# Patient Record
Sex: Male | Born: 1985
Health system: Southern US, Community
[De-identification: ages and names within clinical notes are randomized; demographics above are authoritative.]

---

## 2018-08-05 ENCOUNTER — Ambulatory Visit
Admission: EM | Admit: 2018-08-05 | Discharge: 2018-08-05 | Disposition: A | Discharge status: Home / Self Care | Payer: Self-pay | Attending: Physician Assistant | Admitting: Physician Assistant

## 2018-08-05 DIAGNOSIS — R369 Urethral discharge, unspecified: Secondary | ICD-10-CM | POA: Insufficient documentation

## 2018-08-05 MED ORDER — AZITHROMYCIN 500 MG PO TABS
1000.0000 mg | ORAL_TABLET | Freq: Once | ORAL | Status: AC
  Administered 2018-08-05: 11:00:00 1000 mg via ORAL

## 2018-08-05 MED ORDER — LIDOCAINE HCL (PF) 1 % IJ SOLN
2.0000 mL | Freq: Once | INTRAMUSCULAR | Status: AC
  Administered 2018-08-05: 11:00:00 2 mL

## 2018-08-05 MED ORDER — CEFTRIAXONE SODIUM 250 MG IJ SOLR
250.0000 mg | Freq: Once | INTRAMUSCULAR | Status: AC
  Administered 2018-08-05: 11:00:00 250 mg via INTRAMUSCULAR
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
## 2018-08-05 NOTE — ED Triage Notes (Signed)
Pt c/o penis discharge with burning x3 days

## 2018-08-05 NOTE — Discharge Instructions (Signed)
You were treated empirically for gonorrhea, chlamydia. Azithromycin 1g by mouth and Rocephin 250mg  injection given in office today. Cytology sent, you will be contacted with any positive results that requires further treatment. Refrain from sexual activity and alcohol use for the next 7 days. Monitor for any worsening of symptoms, testicular swelling/pain, ulcers/sore on penis, follow up for reevaluation needed.

## 2018-08-08 LAB — URINE CYTOLOGY ANCILLARY ONLY
Chlamydia: NEGATIVE
Neisseria Gonorrhea: NEGATIVE

## 2019-05-21 ENCOUNTER — Emergency Department (HOSPITAL_COMMUNITY)
Admission: EM | Admit: 2019-05-21 | Discharge: 2019-05-22 | Disposition: A | Discharge status: Home / Self Care | Payer: Self-pay | Attending: Emergency Medicine | Admitting: Emergency Medicine

## 2019-05-21 ENCOUNTER — Other Ambulatory Visit: Payer: Self-pay

## 2019-05-21 ENCOUNTER — Encounter (HOSPITAL_COMMUNITY): Payer: Self-pay | Admitting: Emergency Medicine

## 2019-05-21 DIAGNOSIS — F17210 Nicotine dependence, cigarettes, uncomplicated: Secondary | ICD-10-CM | POA: Insufficient documentation

## 2019-05-21 DIAGNOSIS — R252 Cramp and spasm: Secondary | ICD-10-CM | POA: Insufficient documentation
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
## 2019-05-22 LAB — I-STAT CHEM 8, ED
BUN: 17 mg/dL (ref 6–20)
Calcium, Ion: 1.14 mmol/L — ABNORMAL LOW (ref 1.15–1.40)
Chloride: 102 mmol/L (ref 98–111)
Creatinine, Ser: 1 mg/dL (ref 0.61–1.24)
Glucose, Bld: 83 mg/dL (ref 70–99)
HCT: 49 % (ref 39.0–52.0)
Hemoglobin: 16.7 g/dL (ref 13.0–17.0)
Potassium: 3.7 mmol/L (ref 3.5–5.1)
Sodium: 138 mmol/L (ref 135–145)
TCO2: 27 mmol/L (ref 22–32)

## 2019-05-22 NOTE — ED Notes (Signed)
Patient verbalized understanding of discharge.

## 2019-05-22 NOTE — ED Provider Notes (Signed)
[facility]-EMERGENCY DEPT Provider Note  CSN: [id number] Arrival date & time: 05/21/19 2238  Chief Complaint(s) Numbness  HPI David Patel is a 34 y.o. male who presents to the emergency department for intermittent and sporadic muscle cramping in various muscle groups including legs, abdomen and upper extremities.  No alleviating or aggravating factors.  Patient reports that this occurred couple hours prior to arrival.  Symptoms have resolved.  Patient reports that there was no associated trauma.  He denies any recent fevers or infections.  No excessive exercise.  States that he has been hydrating well.  No vomiting or diarrhea.  Patient endorses marijuana use but denies any other illicit drug use.  No alcohol consumption today.  Patient has not taken any medication.  HPI  Past Medical History History reviewed. No pertinent past medical history. There are no problems to display for this patient.  Home Medication(s) Prior to Admission medications   Not on File                                                                                                                                    Past Surgical History History reviewed. No pertinent surgical history. Family History No family history on file.  Social History Social History   Tobacco Use  . Smoking status: Current Every Day Smoker  . Smokeless tobacco: Never Used  Substance Use Topics  . Alcohol use: Yes  . Drug use: Never   Allergies Patient has no known allergies.  Review of Systems Review of Systems All other systems are reviewed and are negative for acute change except as noted in the HPI  Physical Exam Vital Signs  I have reviewed the triage vital signs BP 118/83 (BP Location: Left Arm)   Pulse 98  Temp 98.6 F (37 C) (Oral)   Resp 16   Ht 5\' 9"  (1.753 m)   Wt 59 kg   SpO2 97%   BMI 19.20 kg/m   Physical Exam Vitals reviewed.  Constitutional:      General: He is not in acute  distress.    Appearance: He is well-developed. He is not diaphoretic.  HENT:     Head: Normocephalic and atraumatic.     Jaw: No trismus.     Right Ear: External ear normal.     Left Ear: External ear normal.     Nose: Nose normal.  Eyes:     General: No scleral icterus.    Conjunctiva/sclera: Conjunctivae normal.  Neck:     Trachea: Phonation normal.  Cardiovascular:     Rate and Rhythm: Normal rate and regular rhythm.  Pulmonary:     Effort: Pulmonary effort is normal. No respiratory distress.     Breath sounds: No stridor.  Abdominal:     General: There is no distension.  Musculoskeletal:        General: No swelling or tenderness. Normal range of motion.  Cervical back: Normal range of motion.     Right lower leg: No edema.     Left lower leg: No edema.     Comments: No Trousseau sign.  Neurological:     Mental Status: He is alert and oriented to person, place, and time.     Sensory: Sensation is intact.     Motor: Motor function is intact. No tremor, atrophy or abnormal muscle tone.  Psychiatric:        Behavior: Behavior normal.     ED Results and Treatments Labs (all labs ordered are listed, but only abnormal results are displayed) Labs Reviewed  I-STAT CHEM 8, ED - Abnormal; Notable for the following components:      Result Value   Calcium, Ion 1.14 (*)    All other components within normal limits                                                                                                                         EKG  EKG Interpretation  Date/Time:    Ventricular Rate:    PR Interval:    QRS Duration:   QT Interval:    QTC Calculation:   R Axis:     Text Interpretation:        Radiology No results found.  Pertinent labs & imaging results that were available during my care of the patient were reviewed by me and considered in my medical decision making (see chart for details).  Medications Ordered in ED Medications - No data to display                                                                                                                                   Procedures Procedures  (including critical care time)  Medical Decision Making / ED Course I have reviewed the nursing notes for this encounter and the patient's prior records (if available in EHR or on provided paperwork).   David Patel was evaluated in Emergency Department on 05/22/2019 for the symptoms described in the history of present illness. He was evaluated in the context of the global COVID-19 pandemic, which necessitated consideration that the patient might be at risk for infection with the SARS-CoV-2 virus that causes COVID-19. Institutional protocols and algorithms that pertain to the evaluation of patients at risk for COVID-19 are in a state of rapid change based on information released by regulatory bodies  including the CDC and federal and state organizations. These policies and algorithms were followed during the patient's care in the ED.       Final Clinical Impression(s) / ED Diagnoses Final diagnoses:  Muscle cramping   Mild hypocalcemia noted on chem panel. Not significant to cause symptoms. Remained asymptomatic.   The patient appears reasonably screened and/or stabilized for discharge and I doubt any other medical condition or other [facility] requiring further screening, evaluation, or treatment in the ED at this time prior to discharge. Safe for discharge with strict return precautions.  Disposition: Discharge  Condition: Good  I have discussed the results, Dx and Tx plan with the patient/family who expressed understanding and agree(s) with the plan. Discharge instructions discussed at length. The patient/family was given strict return precautions who verbalized understanding of the instructions. No further questions at time of discharge.   Declined DC papers.   ED Discharge Orders    None       Follow Up: Primary care provider  Schedule  an appointment as soon as possible for a visit        This chart was dictated using voice recognition software.  Despite best efforts to proofread,  errors can occur which can change the documentation meaning.   Nira Conn, MD 05/22/19 [id number]

## 2019-07-30 ENCOUNTER — Other Ambulatory Visit: Payer: Self-pay

## 2019-07-30 ENCOUNTER — Inpatient Hospital Stay (HOSPITAL_COMMUNITY): Payer: Self-pay

## 2019-07-30 ENCOUNTER — Encounter (HOSPITAL_COMMUNITY): Payer: Self-pay | Admitting: Emergency Medicine

## 2019-07-30 ENCOUNTER — Inpatient Hospital Stay (HOSPITAL_COMMUNITY)
Admission: EM | Admit: 2019-07-30 | Discharge: 2019-08-04 | DRG: 481 | Disposition: A | Discharge status: Home / Self Care | Payer: Self-pay | Attending: Orthopedic Surgery | Admitting: Orthopedic Surgery

## 2019-07-30 ENCOUNTER — Emergency Department (HOSPITAL_COMMUNITY): Payer: Self-pay

## 2019-07-30 DIAGNOSIS — S72352B Displaced comminuted fracture of shaft of left femur, initial encounter for open fracture type I or II: Principal | ICD-10-CM | POA: Diagnosis present

## 2019-07-30 DIAGNOSIS — E559 Vitamin D deficiency, unspecified: Secondary | ICD-10-CM | POA: Diagnosis present

## 2019-07-30 DIAGNOSIS — D62 Acute posthemorrhagic anemia: Secondary | ICD-10-CM | POA: Diagnosis not present

## 2019-07-30 DIAGNOSIS — S72302A Unspecified fracture of shaft of left femur, initial encounter for closed fracture: Secondary | ICD-10-CM | POA: Diagnosis present

## 2019-07-30 DIAGNOSIS — E8889 Other specified metabolic disorders: Secondary | ICD-10-CM | POA: Diagnosis present

## 2019-07-30 DIAGNOSIS — T79A22A Traumatic compartment syndrome of left lower extremity, initial encounter: Secondary | ICD-10-CM | POA: Diagnosis present

## 2019-07-30 DIAGNOSIS — S7292XA Unspecified fracture of left femur, initial encounter for closed fracture: Secondary | ICD-10-CM

## 2019-07-30 DIAGNOSIS — Z20822 Contact with and (suspected) exposure to covid-19: Secondary | ICD-10-CM | POA: Diagnosis present

## 2019-07-30 DIAGNOSIS — W3400XA Accidental discharge from unspecified firearms or gun, initial encounter: Secondary | ICD-10-CM

## 2019-07-30 DIAGNOSIS — T148XXA Other injury of unspecified body region, initial encounter: Secondary | ICD-10-CM

## 2019-07-30 DIAGNOSIS — F17200 Nicotine dependence, unspecified, uncomplicated: Secondary | ICD-10-CM | POA: Diagnosis present

## 2019-07-30 DIAGNOSIS — S7400XA Injury of sciatic nerve at hip and thigh level, unspecified leg, initial encounter: Secondary | ICD-10-CM | POA: Diagnosis present

## 2019-07-30 DIAGNOSIS — F17210 Nicotine dependence, cigarettes, uncomplicated: Secondary | ICD-10-CM | POA: Diagnosis present

## 2019-07-30 DIAGNOSIS — M25462 Effusion, left knee: Secondary | ICD-10-CM | POA: Diagnosis present

## 2019-07-30 DIAGNOSIS — G5702 Lesion of sciatic nerve, left lower limb: Secondary | ICD-10-CM | POA: Diagnosis present

## 2019-07-30 DIAGNOSIS — T79A29A Traumatic compartment syndrome of unspecified lower extremity, initial encounter: Secondary | ICD-10-CM | POA: Diagnosis present

## 2019-07-30 DIAGNOSIS — Y92511 Restaurant or cafe as the place of occurrence of the external cause: Secondary | ICD-10-CM

## 2019-07-30 DIAGNOSIS — Z23 Encounter for immunization: Secondary | ICD-10-CM

## 2019-07-30 DIAGNOSIS — T1490XA Injury, unspecified, initial encounter: Secondary | ICD-10-CM

## 2019-07-30 DIAGNOSIS — S7290XA Unspecified fracture of unspecified femur, initial encounter for closed fracture: Secondary | ICD-10-CM | POA: Diagnosis present

## 2019-07-30 HISTORY — DX: Unspecified fracture of shaft of left femur, initial encounter for closed fracture: S72.302A

## 2019-07-30 LAB — CBC WITH DIFFERENTIAL/PLATELET
Abs Immature Granulocytes: 0.04 10*3/uL (ref 0.00–0.07)
Basophils Absolute: 0.1 10*3/uL (ref 0.0–0.1)
Basophils Relative: 1 %
Eosinophils Absolute: 0.3 10*3/uL (ref 0.0–0.5)
Eosinophils Relative: 4 %
HCT: 48.1 % (ref 39.0–52.0)
Hemoglobin: 14.9 g/dL (ref 13.0–17.0)
Immature Granulocytes: 1 %
Lymphocytes Relative: 39 %
Lymphs Abs: 2.6 10*3/uL (ref 0.7–4.0)
MCH: 26.8 pg (ref 26.0–34.0)
MCHC: 31 g/dL (ref 30.0–36.0)
MCV: 86.4 fL (ref 80.0–100.0)
Monocytes Absolute: 0.6 10*3/uL (ref 0.1–1.0)
Monocytes Relative: 9 %
Neutro Abs: 3.2 10*3/uL (ref 1.7–7.7)
Neutrophils Relative %: 46 %
Platelets: 296 10*3/uL (ref 150–400)
RBC: 5.57 MIL/uL (ref 4.22–5.81)
RDW: 13.8 % (ref 11.5–15.5)
WBC: 6.7 10*3/uL (ref 4.0–10.5)
nRBC: 0 % (ref 0.0–0.2)

## 2019-07-30 LAB — RESPIRATORY PANEL BY RT PCR (FLU A&B, COVID)
Influenza A by PCR: NEGATIVE
Influenza B by PCR: NEGATIVE
SARS Coronavirus 2 by RT PCR: NEGATIVE

## 2019-07-30 LAB — HIV ANTIBODY (ROUTINE TESTING W REFLEX): HIV Screen 4th Generation wRfx: NONREACTIVE

## 2019-07-30 LAB — BASIC METABOLIC PANEL
Anion gap: 17 — ABNORMAL HIGH (ref 5–15)
BUN: 8 mg/dL (ref 6–20)
CO2: 18 mmol/L — ABNORMAL LOW (ref 22–32)
Calcium: 9.2 mg/dL (ref 8.9–10.3)
Chloride: 101 mmol/L (ref 98–111)
Creatinine, Ser: 1.19 mg/dL (ref 0.61–1.24)
GFR calc Af Amer: >60 mL/min (ref >60)
GFR calc non Af Amer: >60 mL/min (ref >60)
Glucose, Bld: 175 mg/dL — ABNORMAL HIGH (ref 70–99)
Potassium: 3.5 mmol/L (ref 3.5–5.1)
Sodium: 136 mmol/L (ref 135–145)

## 2019-07-30 LAB — SURGICAL PCR SCREEN
MRSA, PCR: NEGATIVE
Staphylococcus aureus: NEGATIVE

## 2019-07-30 MED ORDER — METHOCARBAMOL 500 MG PO TABS
500.0000 mg | ORAL_TABLET | Freq: Four times a day (QID) | ORAL | Status: DC | PRN
  Administered 2019-07-30 – 2019-07-31 (×3): 500 mg via ORAL
  Filled 2019-07-30 (×3): qty 1

## 2019-07-30 MED ORDER — DIPHENHYDRAMINE HCL 12.5 MG/5ML PO ELIX: 12.5000 mg | ORAL_SOLUTION | ORAL | Status: DC | PRN

## 2019-07-30 MED ORDER — MORPHINE SULFATE 2 MG/ML IJ SOLN
INTRAMUSCULAR | Status: AC | PRN
  Administered 2019-07-30: 4 mg via INTRAVENOUS

## 2019-07-30 MED ORDER — OXYCODONE HCL 5 MG PO TABS
10.0000 mg | ORAL_TABLET | ORAL | Status: DC | PRN
  Administered 2019-07-30: 15 mg via ORAL
  Administered 2019-07-30: 14:00:00 10 mg via ORAL
  Administered 2019-07-31: 06:00:00 15 mg via ORAL
  Administered 2019-07-31: 10 mg via ORAL
  Administered 2019-07-31 (×2): 15 mg via ORAL
  Filled 2019-07-30: qty 3
  Filled 2019-07-30: qty 2
  Filled 2019-07-30 (×3): qty 3

## 2019-07-30 MED ORDER — ONDANSETRON HCL 4 MG/2ML IJ SOLN
INTRAMUSCULAR | Status: AC
  Filled 2019-07-30: qty 2

## 2019-07-30 MED ORDER — ONDANSETRON HCL 4 MG/2ML IJ SOLN
INTRAMUSCULAR | Status: AC | PRN
  Administered 2019-07-30: 4 mg via INTRAVENOUS

## 2019-07-30 MED ORDER — HYDROMORPHONE HCL 1 MG/ML IJ SOLN
0.5000 mg | INTRAMUSCULAR | Status: DC | PRN
  Administered 2019-07-30: 0.5 mg via INTRAVENOUS
  Administered 2019-07-30 – 2019-07-31 (×3): 1 mg via INTRAVENOUS
  Filled 2019-07-30 (×4): qty 1

## 2019-07-30 MED ORDER — DOCUSATE SODIUM 100 MG PO CAPS
100.0000 mg | ORAL_CAPSULE | Freq: Two times a day (BID) | ORAL | Status: DC
  Filled 2019-07-30: qty 1

## 2019-07-30 MED ORDER — METHOCARBAMOL 1000 MG/10ML IJ SOLN
500.0000 mg | Freq: Four times a day (QID) | INTRAVENOUS | Status: DC | PRN
  Filled 2019-07-30: qty 5

## 2019-07-30 MED ORDER — ACETAMINOPHEN 325 MG PO TABS: 325.0000 mg | ORAL_TABLET | Freq: Four times a day (QID) | ORAL | Status: DC | PRN

## 2019-07-30 MED ORDER — TETANUS-DIPHTH-ACELL PERTUSSIS 5-2.5-18.5 LF-MCG/0.5 IM SUSP
0.5000 mL | Freq: Once | INTRAMUSCULAR | Status: AC
  Administered 2019-07-30: 0.5 mL via INTRAMUSCULAR
  Filled 2019-07-30: qty 0.5

## 2019-07-30 MED ORDER — ONDANSETRON HCL 4 MG/2ML IJ SOLN: 4.0000 mg | Freq: Four times a day (QID) | INTRAMUSCULAR | Status: DC | PRN

## 2019-07-30 MED ORDER — CEFAZOLIN SODIUM-DEXTROSE 2-4 GM/100ML-% IV SOLN: 2.0000 g | Freq: Three times a day (TID) | INTRAVENOUS | Status: DC

## 2019-07-30 MED ORDER — OXYCODONE HCL 5 MG PO TABS
5.0000 mg | ORAL_TABLET | ORAL | Status: DC | PRN
  Administered 2019-07-30: 10 mg via ORAL
  Filled 2019-07-30 (×2): qty 2

## 2019-07-30 MED ORDER — MORPHINE SULFATE (PF) 4 MG/ML IV SOLN
INTRAVENOUS | Status: AC
  Filled 2019-07-30: qty 1

## 2019-07-30 MED ORDER — CEFAZOLIN SODIUM-DEXTROSE 2-4 GM/100ML-% IV SOLN
2.0000 g | Freq: Once | INTRAVENOUS | Status: AC
  Administered 2019-07-30: 07:00:00 2 g via INTRAVENOUS
  Filled 2019-07-30: qty 100

## 2019-07-30 MED ORDER — CEFAZOLIN SODIUM-DEXTROSE 2-4 GM/100ML-% IV SOLN
2.0000 g | Freq: Three times a day (TID) | INTRAVENOUS | Status: AC
  Administered 2019-07-30 – 2019-07-31 (×3): 2 g via INTRAVENOUS
  Filled 2019-07-30 (×3): qty 100

## 2019-07-30 MED ORDER — NAPROXEN 250 MG PO TABS
250.0000 mg | ORAL_TABLET | Freq: Two times a day (BID) | ORAL | Status: DC
  Administered 2019-07-30 (×2): 250 mg via ORAL
  Filled 2019-07-30: qty 1

## 2019-07-30 MED ORDER — ONDANSETRON HCL 4 MG PO TABS: 4.0000 mg | ORAL_TABLET | Freq: Four times a day (QID) | ORAL | Status: DC | PRN
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
## 2019-07-30 NOTE — Progress Notes (Signed)
Orthopedic Tech Progress Note Patient Details:  David Patel 1985-08-12 [id number]  Musculoskeletal Traction Type of Traction: Bucks Skin Traction Traction Location: lle Traction Weight: 10 lbs   Post Interventions Patient Tolerated: Well Instructions Provided: Care of device, Adjustment of device   [name] 07/30/2019, 7:19 AM

## 2019-07-30 NOTE — Progress Notes (Signed)
Pt had cash and credit card in a plastic bag during admission. RN informed Pt to count cash and surrender items to security but Pt refused and said he will hand it to his parents. Checked with Pt at this time, he states he had given the cash and credit card to his parents.
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
## 2019-07-30 NOTE — Plan of Care (Signed)
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
## 2019-07-31 ENCOUNTER — Inpatient Hospital Stay (HOSPITAL_COMMUNITY): Payer: Self-pay | Admitting: Anesthesiology

## 2019-07-31 ENCOUNTER — Inpatient Hospital Stay (HOSPITAL_COMMUNITY): Payer: Self-pay

## 2019-07-31 ENCOUNTER — Encounter (HOSPITAL_COMMUNITY)
Admission: EM | Disposition: A | Discharge status: Home / Self Care | Payer: Self-pay | Source: Home / Self Care | Attending: Orthopedic Surgery

## 2019-07-31 HISTORY — PX: FEMUR IM NAIL: SHX1597

## 2019-07-31 LAB — CBC
HCT: 31.1 % — ABNORMAL LOW (ref 39.0–52.0)
Hemoglobin: 10.2 g/dL — ABNORMAL LOW (ref 13.0–17.0)
MCH: 27.5 pg (ref 26.0–34.0)
MCHC: 32.8 g/dL (ref 30.0–36.0)
MCV: 83.8 fL (ref 80.0–100.0)
Platelets: 227 10*3/uL (ref 150–400)
RBC: 3.71 MIL/uL — ABNORMAL LOW (ref 4.22–5.81)
RDW: 13.3 % (ref 11.5–15.5)
WBC: 16.4 10*3/uL — ABNORMAL HIGH (ref 4.0–10.5)
nRBC: 0 % (ref 0.0–0.2)

## 2019-07-31 LAB — CREATININE, SERUM
Creatinine, Ser: 1.02 mg/dL (ref 0.61–1.24)
GFR calc Af Amer: >60 mL/min (ref >60)
GFR calc non Af Amer: >60 mL/min (ref >60)

## 2019-07-31 SURGERY — INSERTION, INTRAMEDULLARY ROD, FEMUR
Anesthesia: General | Site: Leg Upper | Laterality: Left

## 2019-07-31 MED ORDER — METOCLOPRAMIDE HCL 5 MG/ML IJ SOLN: 5.0000 mg | Freq: Three times a day (TID) | INTRAMUSCULAR | Status: DC | PRN

## 2019-07-31 MED ORDER — PROPOFOL 10 MG/ML IV BOLUS
INTRAVENOUS | Status: AC
  Filled 2019-07-31: qty 20

## 2019-07-31 MED ORDER — LACTATED RINGERS IV SOLN: INTRAVENOUS | Status: DC | PRN

## 2019-07-31 MED ORDER — ALBUMIN HUMAN 5 % IV SOLN: INTRAVENOUS | Status: DC | PRN

## 2019-07-31 MED ORDER — MIDAZOLAM HCL 2 MG/2ML IJ SOLN
INTRAMUSCULAR | Status: AC
  Filled 2019-07-31: qty 2

## 2019-07-31 MED ORDER — CEFAZOLIN SODIUM-DEXTROSE 2-4 GM/100ML-% IV SOLN
INTRAVENOUS | Status: AC
  Filled 2019-07-31: qty 100

## 2019-07-31 MED ORDER — DOCUSATE SODIUM 100 MG PO CAPS
100.0000 mg | ORAL_CAPSULE | Freq: Two times a day (BID) | ORAL | Status: DC
  Administered 2019-07-31 – 2019-08-03 (×6): 100 mg via ORAL
  Filled 2019-07-31 (×7): qty 1

## 2019-07-31 MED ORDER — METHOCARBAMOL 1000 MG/10ML IJ SOLN
500.0000 mg | Freq: Three times a day (TID) | INTRAVENOUS | Status: DC
  Filled 2019-07-31 (×15): qty 5

## 2019-07-31 MED ORDER — DEXAMETHASONE SODIUM PHOSPHATE 10 MG/ML IJ SOLN
INTRAMUSCULAR | Status: DC | PRN
  Administered 2019-07-31: 10 mg via INTRAVENOUS

## 2019-07-31 MED ORDER — METOCLOPRAMIDE HCL 5 MG PO TABS: 5.0000 mg | ORAL_TABLET | Freq: Three times a day (TID) | ORAL | Status: DC | PRN

## 2019-07-31 MED ORDER — ROCURONIUM BROMIDE 50 MG/5ML IV SOSY
PREFILLED_SYRINGE | INTRAVENOUS | Status: DC | PRN
  Administered 2019-07-31: 20 mg via INTRAVENOUS
  Administered 2019-07-31: 30 mg via INTRAVENOUS
  Administered 2019-07-31: 50 mg via INTRAVENOUS

## 2019-07-31 MED ORDER — ACETAMINOPHEN 500 MG PO TABS
500.0000 mg | ORAL_TABLET | Freq: Two times a day (BID) | ORAL | Status: DC
  Administered 2019-07-31 – 2019-08-03 (×7): 500 mg via ORAL
  Filled 2019-07-31 (×7): qty 1

## 2019-07-31 MED ORDER — HYDROMORPHONE HCL 1 MG/ML IJ SOLN
0.5000 mg | INTRAMUSCULAR | Status: DC | PRN
  Administered 2019-08-01 – 2019-08-04 (×10): 1 mg via INTRAVENOUS
  Filled 2019-07-31 (×11): qty 1

## 2019-07-31 MED ORDER — FENTANYL CITRATE (PF) 250 MCG/5ML IJ SOLN
INTRAMUSCULAR | Status: AC
  Filled 2019-07-31: qty 5

## 2019-07-31 MED ORDER — OXYCODONE-ACETAMINOPHEN 7.5-325 MG PO TABS
1.0000 | ORAL_TABLET | Freq: Four times a day (QID) | ORAL | Status: DC | PRN
  Administered 2019-07-31 – 2019-08-04 (×12): 2 via ORAL
  Filled 2019-07-31 (×14): qty 2

## 2019-07-31 MED ORDER — CEFAZOLIN SODIUM-DEXTROSE 2-4 GM/100ML-% IV SOLN
2.0000 g | INTRAVENOUS | Status: AC
  Administered 2019-07-31: 2 g via INTRAVENOUS

## 2019-07-31 MED ORDER — CHLORHEXIDINE GLUCONATE 4 % EX LIQD: 60.0000 mL | Freq: Once | CUTANEOUS | Status: DC

## 2019-07-31 MED ORDER — LACTATED RINGERS IV SOLN: INTRAVENOUS | Status: DC

## 2019-07-31 MED ORDER — LIDOCAINE HCL (CARDIAC) PF 100 MG/5ML IV SOSY
PREFILLED_SYRINGE | INTRAVENOUS | Status: DC | PRN
  Administered 2019-07-31: 30 mg via INTRAVENOUS

## 2019-07-31 MED ORDER — VITAMIN D 25 MCG (1000 UNIT) PO TABS
2000.0000 [IU] | ORAL_TABLET | Freq: Two times a day (BID) | ORAL | Status: DC
  Administered 2019-07-31 – 2019-08-03 (×7): 2000 [IU] via ORAL
  Filled 2019-07-31 (×7): qty 2

## 2019-07-31 MED ORDER — 0.9 % SODIUM CHLORIDE (POUR BTL) OPTIME
TOPICAL | Status: DC | PRN
  Administered 2019-07-31: 18:00:00 1000 mL

## 2019-07-31 MED ORDER — FENTANYL CITRATE (PF) 100 MCG/2ML IJ SOLN
INTRAMUSCULAR | Status: DC | PRN
  Administered 2019-07-31: 100 ug via INTRAVENOUS
  Administered 2019-07-31: 150 ug via INTRAVENOUS

## 2019-07-31 MED ORDER — ASCORBIC ACID 500 MG PO TABS
1000.0000 mg | ORAL_TABLET | Freq: Every day | ORAL | Status: DC
  Administered 2019-07-31 – 2019-08-03 (×4): 1000 mg via ORAL
  Filled 2019-07-31 (×3): qty 2

## 2019-07-31 MED ORDER — POLYETHYLENE GLYCOL 3350 17 G PO PACK
17.0000 g | PACK | Freq: Every day | ORAL | Status: DC
  Administered 2019-08-03: 09:00:00 17 g via ORAL
  Filled 2019-07-31 (×2): qty 1

## 2019-07-31 MED ORDER — POTASSIUM CHLORIDE IN NACL 20-0.9 MEQ/L-% IV SOLN
INTRAVENOUS | Status: DC
  Filled 2019-07-31 (×2): qty 1000

## 2019-07-31 MED ORDER — ONDANSETRON HCL 4 MG/2ML IJ SOLN
INTRAMUSCULAR | Status: DC | PRN
  Administered 2019-07-31: 4 mg via INTRAVENOUS

## 2019-07-31 MED ORDER — POVIDONE-IODINE 10 % EX SWAB: 2.0000 "application " | Freq: Once | CUTANEOUS | Status: DC

## 2019-07-31 MED ORDER — ENOXAPARIN SODIUM 40 MG/0.4ML ~~LOC~~ SOLN
40.0000 mg | SUBCUTANEOUS | Status: DC
  Administered 2019-08-01 – 2019-08-03 (×3): 40 mg via SUBCUTANEOUS
  Filled 2019-07-31 (×3): qty 0.4

## 2019-07-31 MED ORDER — CEFAZOLIN SODIUM-DEXTROSE 1-4 GM/50ML-% IV SOLN
1.0000 g | Freq: Four times a day (QID) | INTRAVENOUS | Status: AC
  Administered 2019-07-31 – 2019-08-02 (×6): 1 g via INTRAVENOUS
  Filled 2019-07-31 (×5): qty 50

## 2019-07-31 MED ORDER — PROPOFOL 10 MG/ML IV BOLUS
INTRAVENOUS | Status: DC | PRN
  Administered 2019-07-31: 150 mg via INTRAVENOUS

## 2019-07-31 MED ORDER — SUGAMMADEX SODIUM 200 MG/2ML IV SOLN
INTRAVENOUS | Status: DC | PRN
  Administered 2019-07-31: 200 mg via INTRAVENOUS

## 2019-07-31 MED ORDER — TRAMADOL HCL 50 MG PO TABS
50.0000 mg | ORAL_TABLET | Freq: Three times a day (TID) | ORAL | Status: DC | PRN
  Administered 2019-08-01 – 2019-08-03 (×6): 50 mg via ORAL
  Filled 2019-07-31 (×6): qty 1

## 2019-07-31 MED ORDER — ONDANSETRON HCL 4 MG PO TABS: 4.0000 mg | ORAL_TABLET | Freq: Four times a day (QID) | ORAL | Status: DC | PRN

## 2019-07-31 MED ORDER — ONDANSETRON HCL 4 MG/2ML IJ SOLN: 4.0000 mg | Freq: Four times a day (QID) | INTRAMUSCULAR | Status: DC | PRN

## 2019-07-31 MED ORDER — MIDAZOLAM HCL 5 MG/5ML IJ SOLN
INTRAMUSCULAR | Status: DC | PRN
  Administered 2019-07-31: 2 mg via INTRAVENOUS

## 2019-07-31 MED ORDER — METHOCARBAMOL 500 MG PO TABS
750.0000 mg | ORAL_TABLET | Freq: Three times a day (TID) | ORAL | Status: DC
  Administered 2019-07-31 – 2019-08-03 (×9): 750 mg via ORAL
  Filled 2019-07-31 (×11): qty 1

## 2019-07-31 SURGICAL SUPPLY — 64 items
BIT DRILL CALIBRATED 4.3MMX365 (DRILL) ×1 IMPLANT
BIT DRILL CROWE PNT TWST 4.5MM (DRILL) ×1 IMPLANT
BLADE SURG 10 STRL SS (BLADE) ×6 IMPLANT
BNDG COHESIVE 4X5 TAN STRL (GAUZE/BANDAGES/DRESSINGS) IMPLANT
BNDG COHESIVE 6X5 TAN STRL LF (GAUZE/BANDAGES/DRESSINGS) ×6 IMPLANT
BNDG ELASTIC 4X5.8 VLCR STR LF (GAUZE/BANDAGES/DRESSINGS) ×3 IMPLANT
BNDG ELASTIC 6X10 VLCR STRL LF (GAUZE/BANDAGES/DRESSINGS) ×3 IMPLANT
BNDG ELASTIC 6X5.8 VLCR STR LF (GAUZE/BANDAGES/DRESSINGS) ×3 IMPLANT
BNDG GAUZE ELAST 4 BULKY (GAUZE/BANDAGES/DRESSINGS) ×6 IMPLANT
BRUSH SCRUB EZ PLAIN DRY (MISCELLANEOUS) ×6 IMPLANT
CANISTER WOUNDNEG PRESSURE 500 (CANNISTER) ×3 IMPLANT
CHLORAPREP W/TINT 26 (MISCELLANEOUS) ×3 IMPLANT
COVER MAYO STAND STRL (DRAPES) ×3 IMPLANT
COVER SURGICAL LIGHT HANDLE (MISCELLANEOUS) ×3 IMPLANT
COVER WAND RF STERILE (DRAPES) ×3 IMPLANT
DRAPE C-ARM 35X43 STRL (DRAPES) ×3 IMPLANT
DRAPE C-ARMOR (DRAPES) ×3 IMPLANT
DRAPE HALF SHEET 40X57 (DRAPES) ×6 IMPLANT
DRAPE IMP U-DRAPE 54X76 (DRAPES) ×6 IMPLANT
DRAPE INCISE IOBAN 66X45 STRL (DRAPES) ×3 IMPLANT
DRAPE ORTHO SPLIT 77X108 STRL (DRAPES) ×4
DRAPE SURG 17X23 STRL (DRAPES) ×3 IMPLANT
DRAPE SURG ORHT 6 SPLT 77X108 (DRAPES) ×2 IMPLANT
DRAPE U-SHAPE 47X51 STRL (DRAPES) ×3 IMPLANT
DRILL CALIBRATED 4.3MMX365 (DRILL) ×3
DRILL CROWE POINT TWIST 4.5MM (DRILL) ×3
DRSG ADAPTIC 3X8 NADH LF (GAUZE/BANDAGES/DRESSINGS) ×3 IMPLANT
DRSG MEPILEX BORDER 4X4 (GAUZE/BANDAGES/DRESSINGS) ×9 IMPLANT
DRSG MEPILEX BORDER 4X8 (GAUZE/BANDAGES/DRESSINGS) ×3 IMPLANT
DRSG VAC ATS MED SENSATRAC (GAUZE/BANDAGES/DRESSINGS) ×3 IMPLANT
ELECT REM PT RETURN 9FT ADLT (ELECTROSURGICAL) ×3
ELECTRODE REM PT RTRN 9FT ADLT (ELECTROSURGICAL) ×1 IMPLANT
GAUZE SPONGE 4X4 12PLY STRL (GAUZE/BANDAGES/DRESSINGS) ×3 IMPLANT
GLOVE BIOGEL M STRL SZ7.5 (GLOVE) ×3 IMPLANT
GLOVE BIOGEL PI IND STRL 8 (GLOVE) ×1 IMPLANT
GLOVE BIOGEL PI INDICATOR 8 (GLOVE) ×2
GOWN STRL REUS W/ TWL LRG LVL3 (GOWN DISPOSABLE) ×1 IMPLANT
GOWN STRL REUS W/ TWL XL LVL3 (GOWN DISPOSABLE) ×1 IMPLANT
GOWN STRL REUS W/TWL LRG LVL3 (GOWN DISPOSABLE) ×2
GOWN STRL REUS W/TWL XL LVL3 (GOWN DISPOSABLE) ×2
GUIDEPIN 3.2X17.5 THRD DISP (PIN) ×3 IMPLANT
GUIDEWIRE BALL NOSE 100CM (WIRE) ×3 IMPLANT
GUIDEWIRE BEAD TIP (WIRE) ×3 IMPLANT
KIT BASIN OR (CUSTOM PROCEDURE TRAY) ×3 IMPLANT
KIT TURNOVER KIT B (KITS) ×3 IMPLANT
MANIFOLD NEPTUNE II (INSTRUMENTS) ×3 IMPLANT
NAIL FEM RETRO 9X360 (Nail) ×3 IMPLANT
NS IRRIG 1000ML POUR BTL (IV SOLUTION) ×3 IMPLANT
PACK GENERAL/GYN (CUSTOM PROCEDURE TRAY) ×3 IMPLANT
PAD ARMBOARD 7.5X6 YLW CONV (MISCELLANEOUS) ×6 IMPLANT
SCREW CORT TI DBL LEAD 5X32 (Screw) ×3 IMPLANT
SCREW CORT TI DBL LEAD 5X34 (Screw) ×3 IMPLANT
SCREW CORT TI DBL LEAD 5X48 (Screw) ×3 IMPLANT
SCREW CORT TI DBL LEAD 5X70 (Screw) ×3 IMPLANT
SET MONITOR QUICK PRESSURE (MISCELLANEOUS) ×3 IMPLANT
STAPLER VISISTAT 35W (STAPLE) ×3 IMPLANT
STOCKINETTE IMPERVIOUS LG (DRAPES) ×3 IMPLANT
SUT MON AB 3-0 SH 27 (SUTURE) ×2
SUT MON AB 3-0 SH27 (SUTURE) ×1 IMPLANT
SUT PDS AB 2-0 CT1 27 (SUTURE) ×3 IMPLANT
TOWEL GREEN STERILE (TOWEL DISPOSABLE) ×6 IMPLANT
TOWEL GREEN STERILE FF (TOWEL DISPOSABLE) ×3 IMPLANT
UNDERPAD 30X30 (UNDERPADS AND DIAPERS) ×3 IMPLANT
WATER STERILE IRR 1000ML POUR (IV SOLUTION) ×3 IMPLANT
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
## 2019-07-31 NOTE — Plan of Care (Signed)

## 2019-07-31 NOTE — Anesthesia Procedure Notes (Signed)
Procedure Name: Intubation Date/Time: 07/31/2019 4:59 PM Performed by: Eligha Bridegroom, CRNA Pre-anesthesia Checklist: Patient identified, Emergency Drugs available, Suction available, Patient being monitored and Timeout performed Patient Re-evaluated:Patient Re-evaluated prior to induction Oxygen Delivery Method: Circle system utilized Preoxygenation: Pre-oxygenation with 100% oxygen Induction Type: IV induction Ventilation: Mask ventilation without difficulty Laryngoscope Size: Mac and 4 Grade View: Grade I Tube type: Oral Airway Equipment and Method: Stylet Placement Confirmation: ETT inserted through vocal cords under direct vision,  positive ETCO2 and breath sounds checked- equal and bilateral Secured at: 22 cm Tube secured with: Tape Dental Injury: Teeth and Oropharynx as per pre-operative assessment

## 2019-07-31 NOTE — Anesthesia Postprocedure Evaluation (Signed)
Anesthesia Post Note  Patient: David Patel  Procedure(s) Performed: INTRAMEDULLARY (IM) NAIL FEMORAL, ASPIRATION LEFT KNEE JOINT, COMPARTMENT RELEASE LEFT THIGH (Left Leg Upper)     Patient location during evaluation: PACU Anesthesia Type: General Level of consciousness: awake and alert Pain management: pain level controlled Vital Signs Assessment: post-procedure vital signs reviewed and stable Respiratory status: spontaneous breathing, nonlabored ventilation, respiratory function stable and patient connected to nasal cannula oxygen Cardiovascular status: blood pressure returned to baseline and stable Postop Assessment: no apparent nausea or vomiting Anesthetic complications: no    Last Vitals:  Vitals:   07/31/19 2008 07/31/19 2045  BP: 115/79 119/68  Pulse: 90 92  Resp: 13 18  Temp: 36.8 C 37.2 C  SpO2: 96% 98%    Last Pain:  Vitals:   07/31/19 2045  TempSrc: Oral  PainSc:                  Cecile Hearing
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
## 2019-07-31 NOTE — Transfer of Care (Signed)
Immediate Anesthesia Transfer of Care Note  Patient: David Patel  Procedure(s) Performed: INTRAMEDULLARY (IM) NAIL FEMORAL, ASPIRATION LEFT KNEE JOINT, COMPARTMENT RELEASE LEFT THIGH (Left Leg Upper)  Patient Location: PACU  Anesthesia Type:General  Level of Consciousness: awake, alert  and oriented  Airway & Oxygen Therapy: Patient Spontanous Breathing  Post-op Assessment: Report given to RN and Post -op Vital signs reviewed and stable  Post vital signs: Reviewed and stable  Last Vitals:  Vitals Value Taken Time  BP 134/73 07/31/19 1939  Temp    Pulse 100 07/31/19 1940  Resp 14 07/31/19 1940  SpO2 100 % 07/31/19 1940  Vitals shown include unvalidated device data.  Last Pain:  Vitals:   07/31/19 1511  TempSrc:   PainSc: 4          Complications: No apparent anesthesia complications
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
## 2019-08-01 ENCOUNTER — Encounter (HOSPITAL_COMMUNITY): Payer: Self-pay | Admitting: Orthopaedic Surgery

## 2019-08-01 DIAGNOSIS — E559 Vitamin D deficiency, unspecified: Secondary | ICD-10-CM | POA: Diagnosis present

## 2019-08-01 DIAGNOSIS — G5702 Lesion of sciatic nerve, left lower limb: Secondary | ICD-10-CM | POA: Diagnosis present

## 2019-08-01 DIAGNOSIS — W3400XA Accidental discharge from unspecified firearms or gun, initial encounter: Secondary | ICD-10-CM

## 2019-08-01 DIAGNOSIS — T79A29A Traumatic compartment syndrome of unspecified lower extremity, initial encounter: Secondary | ICD-10-CM | POA: Diagnosis present

## 2019-08-01 HISTORY — DX: Vitamin D deficiency, unspecified: E55.9

## 2019-08-01 HISTORY — DX: Accidental discharge from unspecified firearms or gun, initial encounter: W34.00XA

## 2019-08-01 HISTORY — DX: Traumatic compartment syndrome of unspecified lower extremity, initial encounter: T79.A29A

## 2019-08-01 LAB — CBC
HCT: 27.9 % — ABNORMAL LOW (ref 39.0–52.0)
Hemoglobin: 8.9 g/dL — ABNORMAL LOW (ref 13.0–17.0)
MCH: 26.8 pg (ref 26.0–34.0)
MCHC: 31.9 g/dL (ref 30.0–36.0)
MCV: 84 fL (ref 80.0–100.0)
Platelets: 205 10*3/uL (ref 150–400)
RBC: 3.32 MIL/uL — ABNORMAL LOW (ref 4.22–5.81)
RDW: 13.3 % (ref 11.5–15.5)
WBC: 11.5 10*3/uL — ABNORMAL HIGH (ref 4.0–10.5)
nRBC: 0 % (ref 0.0–0.2)

## 2019-08-01 LAB — POCT I-STAT, CHEM 8
BUN: 7 mg/dL (ref 6–20)
Calcium, Ion: 1.27 mmol/L (ref 1.15–1.40)
Chloride: 98 mmol/L (ref 98–111)
Creatinine, Ser: 0.9 mg/dL (ref 0.61–1.24)
Glucose, Bld: 101 mg/dL — ABNORMAL HIGH (ref 70–99)
HCT: 33 % — ABNORMAL LOW (ref 39.0–52.0)
Hemoglobin: 11.2 g/dL — ABNORMAL LOW (ref 13.0–17.0)
Potassium: 3.3 mmol/L — ABNORMAL LOW (ref 3.5–5.1)
Sodium: 136 mmol/L (ref 135–145)
TCO2: 27 mmol/L (ref 22–32)

## 2019-08-01 LAB — RENAL FUNCTION PANEL
Albumin: 3.5 g/dL (ref 3.5–5.0)
Anion gap: 11 (ref 5–15)
BUN: 6 mg/dL (ref 6–20)
CO2: 24 mmol/L (ref 22–32)
Calcium: 8.8 mg/dL — ABNORMAL LOW (ref 8.9–10.3)
Chloride: 101 mmol/L (ref 98–111)
Creatinine, Ser: 0.97 mg/dL (ref 0.61–1.24)
GFR calc Af Amer: >60 mL/min (ref >60)
GFR calc non Af Amer: >60 mL/min (ref >60)
Glucose, Bld: 146 mg/dL — ABNORMAL HIGH (ref 70–99)
Phosphorus: 3.9 mg/dL (ref 2.5–4.6)
Potassium: 4.4 mmol/L (ref 3.5–5.1)
Sodium: 136 mmol/L (ref 135–145)

## 2019-08-01 LAB — VITAMIN D 25 HYDROXY (VIT D DEFICIENCY, FRACTURES): Vit D, 25-Hydroxy: 17.69 ng/mL — ABNORMAL LOW (ref 30–100)

## 2019-08-01 NOTE — Plan of Care (Signed)
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
## 2019-08-01 NOTE — Progress Notes (Signed)
Orthopedic Tech Progress Note Patient Details:  David Patel 1985-09-12 [id number] Spoke with THERAPY about a pair of crutches for patient. She said she'll  Work with patient on crutches later. Told patient not to work with crutches until therapy gives him the green light. Ortho Devices Type of Ortho Device: Crutches Ortho Device/Splint Interventions: Other (comment)   Post Interventions Patient Tolerated: Other (comment) Instructions Provided: Other (comment)   Donald Pore 08/01/2019, 11:43 AM
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
## 2019-08-01 NOTE — Progress Notes (Signed)
Orthopedic Tech Progress Note Patient Details:  David Patel Jul 10, 1985 [id number] Called in order to [facility] for a NIGHT SPLINT and was called by THERAPY to bring patient a pair of crutches Ortho Devices Type of Ortho Device: Crutches Ortho Device/Splint Interventions: Other (comment)   Post Interventions Patient Tolerated: Other (comment) Instructions Provided: Other (comment)   Donald Pore 08/01/2019, 12:01 PM

## 2019-08-02 LAB — CBC
HCT: 21.8 % — ABNORMAL LOW (ref 39.0–52.0)
Hemoglobin: 7 g/dL — ABNORMAL LOW (ref 13.0–17.0)
MCH: 27.1 pg (ref 26.0–34.0)
MCHC: 32.1 g/dL (ref 30.0–36.0)
MCV: 84.5 fL (ref 80.0–100.0)
Platelets: 196 10*3/uL (ref 150–400)
RBC: 2.58 MIL/uL — ABNORMAL LOW (ref 4.22–5.81)
RDW: 13.1 % (ref 11.5–15.5)
WBC: 8.8 10*3/uL (ref 4.0–10.5)
nRBC: 0 % (ref 0.0–0.2)

## 2019-08-02 MED ORDER — GABAPENTIN 300 MG PO CAPS
300.0000 mg | ORAL_CAPSULE | Freq: Two times a day (BID) | ORAL | Status: DC
  Administered 2019-08-02 – 2019-08-03 (×4): 300 mg via ORAL
  Filled 2019-08-02 (×4): qty 1
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
## 2019-08-02 NOTE — Plan of Care (Signed)
  Problem: Education: Goal: Knowledge of General Education information will improve Description: Including pain rating scale, medication(s)/side effects and non-pharmacologic comfort measures Outcome: Progressing   Problem: Health Behavior/Discharge Planning: Goal: Ability to manage health-related needs will improve Outcome: Progressing   Problem: Clinical Measurements: Goal: Will remain free from infection Outcome: Progressing   Problem: Coping: Goal: Level of anxiety will decrease Outcome: Progressing   Problem: Elimination: Goal: Will not experience complications related to bowel motility Outcome: Progressing   Problem: Safety: Goal: Ability to remain free from injury will improve Outcome: Progressing   Problem: Skin Integrity: Goal: Risk for impaired skin integrity will decrease Outcome: Progressing
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
## 2019-08-03 LAB — CBC
HCT: 22.3 % — ABNORMAL LOW (ref 39.0–52.0)
Hemoglobin: 7.1 g/dL — ABNORMAL LOW (ref 13.0–17.0)
MCH: 26.9 pg (ref 26.0–34.0)
MCHC: 31.8 g/dL (ref 30.0–36.0)
MCV: 84.5 fL (ref 80.0–100.0)
Platelets: 248 10*3/uL (ref 150–400)
RBC: 2.64 MIL/uL — ABNORMAL LOW (ref 4.22–5.81)
RDW: 13 % (ref 11.5–15.5)
WBC: 8.6 10*3/uL (ref 4.0–10.5)
nRBC: 0 % (ref 0.0–0.2)

## 2019-08-03 MED ORDER — CEFAZOLIN SODIUM-DEXTROSE 2-4 GM/100ML-% IV SOLN
2.0000 g | INTRAVENOUS | Status: AC
  Administered 2019-08-04: 2 g via INTRAVENOUS
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
## 2019-08-03 NOTE — Plan of Care (Signed)

## 2019-08-04 ENCOUNTER — Inpatient Hospital Stay (HOSPITAL_COMMUNITY): Payer: Self-pay | Admitting: Anesthesiology

## 2019-08-04 ENCOUNTER — Encounter (HOSPITAL_COMMUNITY)
Admission: EM | Disposition: A | Discharge status: Home / Self Care | Payer: Self-pay | Source: Home / Self Care | Attending: Orthopedic Surgery

## 2019-08-04 ENCOUNTER — Encounter (HOSPITAL_COMMUNITY): Payer: Self-pay | Admitting: Orthopaedic Surgery

## 2019-08-04 DIAGNOSIS — F17200 Nicotine dependence, unspecified, uncomplicated: Secondary | ICD-10-CM

## 2019-08-04 HISTORY — DX: Nicotine dependence, unspecified, uncomplicated: F17.200

## 2019-08-04 HISTORY — PX: DEBRIDEMENT AND CLOSURE WOUND: SHX5614

## 2019-08-04 LAB — CBC
HCT: 23.2 % — ABNORMAL LOW (ref 39.0–52.0)
Hemoglobin: 7.4 g/dL — ABNORMAL LOW (ref 13.0–17.0)
MCH: 27.1 pg (ref 26.0–34.0)
MCHC: 31.9 g/dL (ref 30.0–36.0)
MCV: 85 fL (ref 80.0–100.0)
Platelets: 314 10*3/uL (ref 150–400)
RBC: 2.73 MIL/uL — ABNORMAL LOW (ref 4.22–5.81)
RDW: 13 % (ref 11.5–15.5)
WBC: 10.6 10*3/uL — ABNORMAL HIGH (ref 4.0–10.5)
nRBC: 0 % (ref 0.0–0.2)

## 2019-08-04 SURGERY — DEBRIDEMENT, WOUND, WITH CLOSURE
Anesthesia: General | Laterality: Left

## 2019-08-04 MED ORDER — ONDANSETRON HCL 4 MG/2ML IJ SOLN
INTRAMUSCULAR | Status: AC
  Filled 2019-08-04: qty 2

## 2019-08-04 MED ORDER — DEXAMETHASONE SODIUM PHOSPHATE 10 MG/ML IJ SOLN
INTRAMUSCULAR | Status: AC
  Filled 2019-08-04: qty 1

## 2019-08-04 MED ORDER — PHENYLEPHRINE 40 MCG/ML (10ML) SYRINGE FOR IV PUSH (FOR BLOOD PRESSURE SUPPORT)
PREFILLED_SYRINGE | INTRAVENOUS | Status: AC
  Filled 2019-08-04: qty 10

## 2019-08-04 MED ORDER — OXYCODONE HCL 5 MG PO TABS
ORAL_TABLET | ORAL | Status: AC
  Filled 2019-08-04: qty 1

## 2019-08-04 MED ORDER — OXYCODONE HCL 5 MG/5ML PO SOLN: 5.0000 mg | Freq: Once | ORAL | Status: AC | PRN

## 2019-08-04 MED ORDER — OXYCODONE-ACETAMINOPHEN 5-325 MG PO TABS: 1.0000 | ORAL_TABLET | Freq: Four times a day (QID) | ORAL | 0 refills | Status: AC | PRN

## 2019-08-04 MED ORDER — RIVAROXABAN 15 MG PO TABS
15.0000 mg | ORAL_TABLET | Freq: Every day | ORAL | 0 refills | Status: DC
Start: 2019-08-04 — End: 2019-09-04

## 2019-08-04 MED ORDER — LACTATED RINGERS IV SOLN: INTRAVENOUS | Status: DC

## 2019-08-04 MED ORDER — ACETAMINOPHEN 10 MG/ML IV SOLN: 1000.0000 mg | Freq: Once | INTRAVENOUS | Status: DC | PRN

## 2019-08-04 MED ORDER — LIDOCAINE 2% (20 MG/ML) 5 ML SYRINGE
INTRAMUSCULAR | Status: AC
  Filled 2019-08-04: qty 5

## 2019-08-04 MED ORDER — FENTANYL CITRATE (PF) 100 MCG/2ML IJ SOLN
INTRAMUSCULAR | Status: DC | PRN
  Administered 2019-08-04 (×5): 50 ug via INTRAVENOUS

## 2019-08-04 MED ORDER — MIDAZOLAM HCL 2 MG/2ML IJ SOLN
INTRAMUSCULAR | Status: AC
  Filled 2019-08-04: qty 2

## 2019-08-04 MED ORDER — OXYCODONE HCL 5 MG PO TABS
5.0000 mg | ORAL_TABLET | Freq: Once | ORAL | Status: AC | PRN
  Administered 2019-08-04: 5 mg via ORAL

## 2019-08-04 MED ORDER — PROPOFOL 10 MG/ML IV BOLUS
INTRAVENOUS | Status: AC
  Filled 2019-08-04: qty 20

## 2019-08-04 MED ORDER — PHENYLEPHRINE 40 MCG/ML (10ML) SYRINGE FOR IV PUSH (FOR BLOOD PRESSURE SUPPORT)
PREFILLED_SYRINGE | INTRAVENOUS | Status: DC | PRN
  Administered 2019-08-04: 80 ug via INTRAVENOUS
  Administered 2019-08-04: 120 ug via INTRAVENOUS

## 2019-08-04 MED ORDER — ONDANSETRON HCL 4 MG/2ML IJ SOLN
INTRAMUSCULAR | Status: DC | PRN
  Administered 2019-08-04: 4 mg via INTRAVENOUS

## 2019-08-04 MED ORDER — DOCUSATE SODIUM 100 MG PO CAPS: 100.0000 mg | ORAL_CAPSULE | Freq: Two times a day (BID) | ORAL | 0 refills | Status: AC

## 2019-08-04 MED ORDER — ACETAMINOPHEN 500 MG PO TABS: 500.0000 mg | ORAL_TABLET | Freq: Two times a day (BID) | ORAL | 0 refills | Status: AC

## 2019-08-04 MED ORDER — METHOCARBAMOL 500 MG PO TABS: 500.0000 mg | ORAL_TABLET | Freq: Four times a day (QID) | ORAL | 0 refills | Status: AC | PRN

## 2019-08-04 MED ORDER — PROPOFOL 10 MG/ML IV BOLUS
INTRAVENOUS | Status: DC | PRN
  Administered 2019-08-04: 200 mg via INTRAVENOUS

## 2019-08-04 MED ORDER — GABAPENTIN 300 MG PO CAPS: 300.0000 mg | ORAL_CAPSULE | Freq: Two times a day (BID) | ORAL | 0 refills | Status: DC

## 2019-08-04 MED ORDER — KETOROLAC TROMETHAMINE 30 MG/ML IJ SOLN: 30.0000 mg | Freq: Once | INTRAMUSCULAR | Status: DC

## 2019-08-04 MED ORDER — FENTANYL CITRATE (PF) 250 MCG/5ML IJ SOLN
INTRAMUSCULAR | Status: AC
  Filled 2019-08-04: qty 5

## 2019-08-04 MED ORDER — VITAMIN D3 25 MCG PO TABS: 2000.0000 [IU] | ORAL_TABLET | Freq: Two times a day (BID) | ORAL | 2 refills | Status: DC

## 2019-08-04 MED ORDER — LIDOCAINE 2% (20 MG/ML) 5 ML SYRINGE
INTRAMUSCULAR | Status: DC | PRN
  Administered 2019-08-04: 60 mg via INTRAVENOUS

## 2019-08-04 MED ORDER — RIVAROXABAN 10 MG PO TABS
10.0000 mg | ORAL_TABLET | Freq: Every day | ORAL | 0 refills | Status: DC
Start: 2019-08-05 — End: 2019-08-04

## 2019-08-04 MED ORDER — HYDROMORPHONE HCL 1 MG/ML IJ SOLN
0.2500 mg | INTRAMUSCULAR | Status: DC | PRN
  Administered 2019-08-04: 10:00:00 0.25 mg via INTRAVENOUS
  Administered 2019-08-04: 10:00:00 0.5 mg via INTRAVENOUS

## 2019-08-04 MED ORDER — 0.9 % SODIUM CHLORIDE (POUR BTL) OPTIME
TOPICAL | Status: DC | PRN
  Administered 2019-08-04: 2000 mL

## 2019-08-04 MED ORDER — DEXAMETHASONE SODIUM PHOSPHATE 10 MG/ML IJ SOLN
INTRAMUSCULAR | Status: DC | PRN
  Administered 2019-08-04: 10 mg via INTRAVENOUS

## 2019-08-04 MED ORDER — MIDAZOLAM HCL 5 MG/5ML IJ SOLN
INTRAMUSCULAR | Status: DC | PRN
  Administered 2019-08-04: 2 mg via INTRAVENOUS

## 2019-08-04 MED ORDER — PROMETHAZINE HCL 25 MG/ML IJ SOLN: 6.2500 mg | INTRAMUSCULAR | Status: DC | PRN

## 2019-08-04 MED ORDER — CHLORHEXIDINE GLUCONATE 4 % EX LIQD
CUTANEOUS | Status: AC
  Filled 2019-08-04: qty 15

## 2019-08-04 MED ORDER — ASCORBIC ACID 1000 MG PO TABS: 1000.0000 mg | ORAL_TABLET | Freq: Every day | ORAL | 1 refills | Status: AC

## 2019-08-04 MED ORDER — HYDROMORPHONE HCL 1 MG/ML IJ SOLN
INTRAMUSCULAR | Status: AC
  Filled 2019-08-04: qty 1

## 2019-08-04 MED FILL — METHOCARBAMOL 500 MG TABS: 500 | 7 days supply | Qty: 60 | Fill #0

## 2019-08-04 MED FILL — ACETAMINOPHEN 500MG XT STRE: 500 | 15 days supply | Qty: 30 | Fill #0

## 2019-08-04 MED FILL — GABAPENTIN 300 MG CAPSULE: 300 | 14 days supply | Qty: 28 | Fill #0

## 2019-08-04 MED FILL — VITAMIN C 500 MG TABLET: 500 | 30 days supply | Qty: 60 | Fill #0

## 2019-08-04 MED FILL — VITAMIN D3 25 MCG TABS: 25 | 30 days supply | Qty: 120 | Fill #0

## 2019-08-04 MED FILL — DOK 100 MG CAPS: 100 | 5 days supply | Qty: 10 | Fill #0

## 2019-08-04 MED FILL — XARELTO 15 MG TABLET: 15 | 30 days supply | Qty: 30 | Fill #0

## 2019-08-04 MED FILL — OXYCODONE-APAP 5-325MG: 5-325 | 7 days supply | Qty: 50 | Fill #0

## 2019-08-04 SURGICAL SUPPLY — 51 items
BNDG COHESIVE 4X5 TAN STRL (GAUZE/BANDAGES/DRESSINGS) ×3 IMPLANT
BNDG ELASTIC 4X5.8 VLCR STR LF (GAUZE/BANDAGES/DRESSINGS) ×2 IMPLANT
BNDG ELASTIC 6X10 VLCR STRL LF (GAUZE/BANDAGES/DRESSINGS) ×2 IMPLANT
BNDG GAUZE ELAST 4 BULKY (GAUZE/BANDAGES/DRESSINGS) ×6 IMPLANT
BRUSH SCRUB EZ PLAIN DRY (MISCELLANEOUS) ×6 IMPLANT
CANISTER PREVENA PLUS 150 (CANNISTER) ×2 IMPLANT
COVER SURGICAL LIGHT HANDLE (MISCELLANEOUS) ×6 IMPLANT
COVER WAND RF STERILE (DRAPES) IMPLANT
DRAPE U-SHAPE 47X51 STRL (DRAPES) ×3 IMPLANT
DRESSING PREVENA PLUS CUSTOM (GAUZE/BANDAGES/DRESSINGS) IMPLANT
DRSG ADAPTIC 3X8 NADH LF (GAUZE/BANDAGES/DRESSINGS) ×3 IMPLANT
DRSG PREVENA PLUS CUSTOM (GAUZE/BANDAGES/DRESSINGS) ×3
ELECT REM PT RETURN 9FT ADLT (ELECTROSURGICAL)
ELECTRODE REM PT RTRN 9FT ADLT (ELECTROSURGICAL) IMPLANT
GAUZE SPONGE 4X4 12PLY STRL (GAUZE/BANDAGES/DRESSINGS) ×3 IMPLANT
GLOVE BIO SURGEON STRL SZ7.5 (GLOVE) ×3 IMPLANT
GLOVE BIO SURGEON STRL SZ8 (GLOVE) ×3 IMPLANT
GLOVE BIOGEL PI IND STRL 7.5 (GLOVE) ×1 IMPLANT
GLOVE BIOGEL PI IND STRL 8 (GLOVE) ×1 IMPLANT
GLOVE BIOGEL PI IND STRL 9 (GLOVE) ×1 IMPLANT
GLOVE BIOGEL PI INDICATOR 7.5 (GLOVE) ×2
GLOVE BIOGEL PI INDICATOR 8 (GLOVE) ×2
GLOVE BIOGEL PI INDICATOR 9 (GLOVE) ×2
GOWN STRL REUS W/ TWL LRG LVL3 (GOWN DISPOSABLE) ×2 IMPLANT
GOWN STRL REUS W/ TWL XL LVL3 (GOWN DISPOSABLE) ×1 IMPLANT
GOWN STRL REUS W/TWL LRG LVL3 (GOWN DISPOSABLE) ×4
GOWN STRL REUS W/TWL XL LVL3 (GOWN DISPOSABLE) ×2
HANDPIECE INTERPULSE COAX TIP (DISPOSABLE)
KIT BASIN OR (CUSTOM PROCEDURE TRAY) ×3 IMPLANT
KIT DRSG PREVENA PLUS 7DAY 125 (MISCELLANEOUS) ×2 IMPLANT
KIT TURNOVER KIT B (KITS) ×3 IMPLANT
MANIFOLD NEPTUNE II (INSTRUMENTS) ×3 IMPLANT
NS IRRIG 1000ML POUR BTL (IV SOLUTION) ×3 IMPLANT
PACK ORTHO EXTREMITY (CUSTOM PROCEDURE TRAY) ×3 IMPLANT
PAD ARMBOARD 7.5X6 YLW CONV (MISCELLANEOUS) ×6 IMPLANT
PADDING CAST COTTON 6X4 STRL (CAST SUPPLIES) ×3 IMPLANT
SET HNDPC FAN SPRY TIP SCT (DISPOSABLE) IMPLANT
SOL PREP POV-IOD 4OZ 10% (MISCELLANEOUS) ×3 IMPLANT
SOL PREP PROV IODINE SCRUB 4OZ (MISCELLANEOUS) ×3 IMPLANT
SPONGE LAP 18X18 RF (DISPOSABLE) ×3 IMPLANT
STOCKINETTE IMPERVIOUS 9X36 MD (GAUZE/BANDAGES/DRESSINGS) IMPLANT
SUT PDS AB 2-0 CT1 27 (SUTURE) IMPLANT
SUT VIC AB 1 CT1 18XCR BRD 8 (SUTURE) IMPLANT
SUT VIC AB 1 CT1 8-18 (SUTURE) ×2
TOWEL GREEN STERILE (TOWEL DISPOSABLE) ×6 IMPLANT
TOWEL GREEN STERILE FF (TOWEL DISPOSABLE) ×3 IMPLANT
TUBE CONNECTING 12'X1/4 (SUCTIONS) ×1
TUBE CONNECTING 12X1/4 (SUCTIONS) ×2 IMPLANT
UNDERPAD 30X30 (UNDERPADS AND DIAPERS) ×3 IMPLANT
WATER STERILE IRR 1000ML POUR (IV SOLUTION) ×3 IMPLANT
YANKAUER SUCT BULB TIP NO VENT (SUCTIONS) ×3 IMPLANT

## 2019-08-04 NOTE — Anesthesia Postprocedure Evaluation (Signed)
Anesthesia Post Note  Patient: Tylon Kemmerling  Procedure(s) Performed: DEBRIDEMENT AND CLOSURE WOUND (Left )     Patient location during evaluation: PACU Anesthesia Type: General Level of consciousness: awake and alert Pain management: pain level controlled Vital Signs Assessment: post-procedure vital signs reviewed and stable Respiratory status: spontaneous breathing, nonlabored ventilation, respiratory function stable and patient connected to nasal cannula oxygen Cardiovascular status: blood pressure returned to baseline and stable Postop Assessment: no apparent nausea or vomiting Anesthetic complications: no    Last Vitals:  Vitals:   08/04/19 1035 08/04/19 1053  BP: (!) 144/94 (!) 145/97  Pulse: (!) 102 100  Resp: 19 17  Temp: 36.7 C 36.8 C  SpO2: 100% 100%    Last Pain:  Vitals:   08/04/19 1035  TempSrc:   PainSc: 10-Worst pain ever                 Cheyane Ayon P Rajean Desantiago

## 2019-08-04 NOTE — Transfer of Care (Signed)
Immediate Anesthesia Transfer of Care Note  Patient: David Patel  Procedure(s) Performed: DEBRIDEMENT AND CLOSURE WOUND (Left )  Patient Location: PACU  Anesthesia Type:General  Level of Consciousness: sedated and patient cooperative  Airway & Oxygen Therapy: Patient Spontanous Breathing and Patient connected to nasal cannula oxygen  Post-op Assessment: Report given to RN and Post -op Vital signs reviewed and stable  Post vital signs: Reviewed  Last Vitals:  Vitals Value Taken Time  BP 140/103 08/04/19 0940  Temp 36.6 C 08/04/19 0940  Pulse 89 08/04/19 0944  Resp 14 08/04/19 0944  SpO2 97 % 08/04/19 0944  Vitals shown include unvalidated device data.  Last Pain:  Vitals:   08/04/19 0940  TempSrc:   PainSc: Asleep      Patients Stated Pain Goal: 2 (08/03/19 2110)  Complications: No apparent anesthesia complications
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
## 2019-08-04 NOTE — Anesthesia Procedure Notes (Signed)
Procedure Name: LMA Insertion Date/Time: 08/04/2019 8:12 AM Performed by: Lovie Chol, CRNA Pre-anesthesia Checklist: Patient identified, Emergency Drugs available, Suction available and Patient being monitored Patient Re-evaluated:Patient Re-evaluated prior to induction Oxygen Delivery Method: Circle System Utilized Preoxygenation: Pre-oxygenation with 100% oxygen Induction Type: IV induction Ventilation: Mask ventilation without difficulty LMA: LMA inserted LMA Size: 4.0 Number of attempts: 1 Airway Equipment and Method: Bite block Placement Confirmation: positive ETCO2 and breath sounds checked- equal and bilateral Tube secured with: Tape Dental Injury: Teeth and Oropharynx as per pre-operative assessment
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
## 2019-08-04 NOTE — Progress Notes (Signed)
PT Cancellation Note  Patient Details Name: David Patel MRN: [id number] DOB: 1985-05-25   Cancelled Treatment:    Reason Eval/Treat Not Completed: (P) Pain limiting ability to participate(PTA entered room after nurse asked for assistance to toilet patient.  Pt in pain seated edge of bed and agitated he has not had lunch or been to the bathroom.   He denies need for PT to review stairs before d/c.)   Keifer Habib J Aundria Rud 08/04/2019, 2:10 PM  Bonney Leitz , PTA Acute Rehabilitation Services Pager [phone] Office [phone]

## 2019-08-04 NOTE — Progress Notes (Signed)
Discharge paperwork and information given to pt. Pt agitated and has been rude to nurse and nurse tech since back from sx. Pt has refused lunch multiple times, but complained he has not ate since last night. Pt waiting for [facility] team to deliver medication to d/c.

## 2019-08-04 NOTE — Brief Op Note (Signed)
08/04/2019  10:15 AM  PATIENT:  David Patel  34 y.o. male  PRE-OPERATIVE DIAGNOSIS:  compartment syndrome left thigh s/p fasciotomy  POST-OPERATIVE DIAGNOSIS:  compartment syndrome left thigh s/p fasciotomy  PROCEDURE:  Procedure(s): 1. COMPLEX CLOSURE WITH RETENTION SUTURES 25 CM 2. WOUND VAC DRESSING CHANGE UNDER ANESTHESIA (Left)  SURGEON:  Surgeon(s) and Role:    * Myrene Galas, MD - Primary  PHYSICIAN ASSISTANT: Montez Morita, PA-C  ANESTHESIA:   general  EBL:  10 mL   BLOOD ADMINISTERED:none  DRAINS: none   LOCAL MEDICATIONS USED:  NONE  SPECIMEN:  No Specimen  DISPOSITION OF SPECIMEN:  N/A  COUNTS:  YES  TOURNIQUET:  * No tourniquets in log *  DICTATION: [id number]  PLAN OF CARE: Admit to inpatient   PATIENT DISPOSITION:  PACU - hemodynamically stable.   Delay start of Pharmacological VTE agent (>24hrs) due to surgical blood loss or risk of bleeding: no
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
## 2019-08-07 ENCOUNTER — Encounter (HOSPITAL_COMMUNITY): Payer: Self-pay | Admitting: Emergency Medicine

## 2019-09-04 ENCOUNTER — Ambulatory Visit: Payer: Self-pay | Attending: Internal Medicine | Admitting: Internal Medicine

## 2019-09-04 ENCOUNTER — Encounter: Payer: Self-pay | Admitting: Internal Medicine

## 2019-09-04 ENCOUNTER — Other Ambulatory Visit: Payer: Self-pay

## 2019-09-04 VITALS — BP 110/76 | HR 86 | Temp 97.7°F | Resp 16

## 2019-09-04 DIAGNOSIS — S72302S Unspecified fracture of shaft of left femur, sequela: Secondary | ICD-10-CM

## 2019-09-04 DIAGNOSIS — D649 Anemia, unspecified: Secondary | ICD-10-CM | POA: Insufficient documentation

## 2019-09-04 DIAGNOSIS — F17200 Nicotine dependence, unspecified, uncomplicated: Secondary | ICD-10-CM

## 2019-09-04 DIAGNOSIS — E559 Vitamin D deficiency, unspecified: Secondary | ICD-10-CM

## 2019-09-04 DIAGNOSIS — R739 Hyperglycemia, unspecified: Secondary | ICD-10-CM

## 2019-09-04 DIAGNOSIS — Z09 Encounter for follow-up examination after completed treatment for conditions other than malignant neoplasm: Secondary | ICD-10-CM

## 2019-09-04 DIAGNOSIS — Z1331 Encounter for screening for depression: Secondary | ICD-10-CM

## 2019-09-04 DIAGNOSIS — G5792 Unspecified mononeuropathy of left lower limb: Secondary | ICD-10-CM

## 2019-09-04 MED ORDER — GABAPENTIN 300 MG PO CAPS: 300.0000 mg | ORAL_CAPSULE | Freq: Two times a day (BID) | ORAL | 1 refills | Status: AC

## 2019-09-04 NOTE — Patient Instructions (Signed)
I have sent a refill on gabapentin to your pharmacy.  If you have completed the vitamin D that they prescribed for you, you can now purchase vitamin D over-the-counter as Vitamin D 400 IU and take one a day.
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
## 2019-09-05 ENCOUNTER — Telehealth: Payer: Self-pay

## 2019-09-05 LAB — CBC
Hematocrit: 46.9 % (ref 37.5–51.0)
Hemoglobin: 14.7 g/dL (ref 13.0–17.7)
MCH: 26.8 pg (ref 26.6–33.0)
MCHC: 31.3 g/dL — ABNORMAL LOW (ref 31.5–35.7)
MCV: 85 fL (ref 79–97)
Platelets: 152 10*3/uL (ref 150–450)
RBC: 5.49 x10E6/uL (ref 4.14–5.80)
RDW: 13 % (ref 11.6–15.4)
WBC: 6.3 10*3/uL (ref 3.4–10.8)

## 2019-09-05 LAB — HEMOGLOBIN A1C
Est. average glucose Bld gHb Est-mCnc: 80 mg/dL
Hgb A1c MFr Bld: 4.4 % — ABNORMAL LOW (ref 4.8–5.6)

## 2019-09-05 LAB — VITAMIN D 25 HYDROXY (VIT D DEFICIENCY, FRACTURES): Vit D, 25-Hydroxy: 49.4 ng/mL (ref 30.0–100.0)

## 2019-09-05 NOTE — Telephone Encounter (Signed)
Contacted pt to go over lab results pt is aware and doesn't have any questions or concerns 
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
## 2021-03-24 IMAGING — DX DG FEMUR 2+V PORT*L*
4 series · 4 of 4 positions shown · non-contrast
Comparison: 07/30/2019

CLINICAL DATA: Postop

EXAM:
LEFT FEMUR PORTABLE 2 VIEWS

[femur lat (1 of 2)]
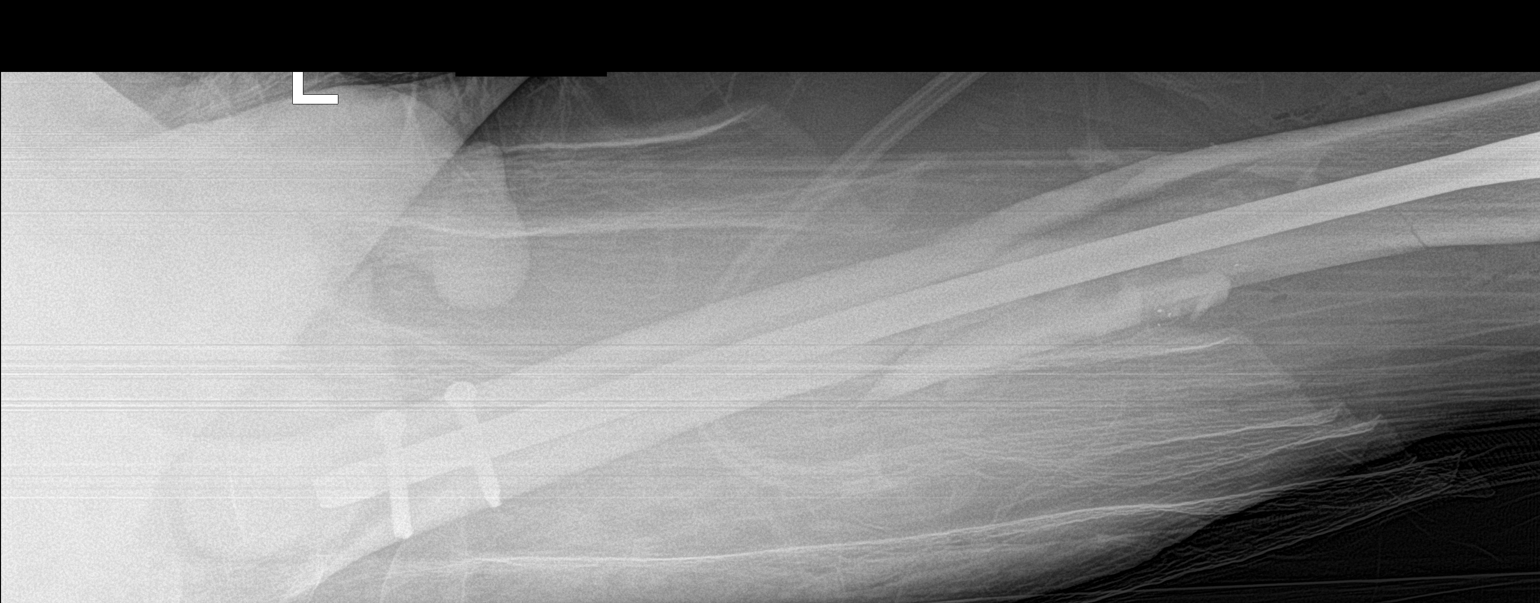

[femur ap (1 of 2)]
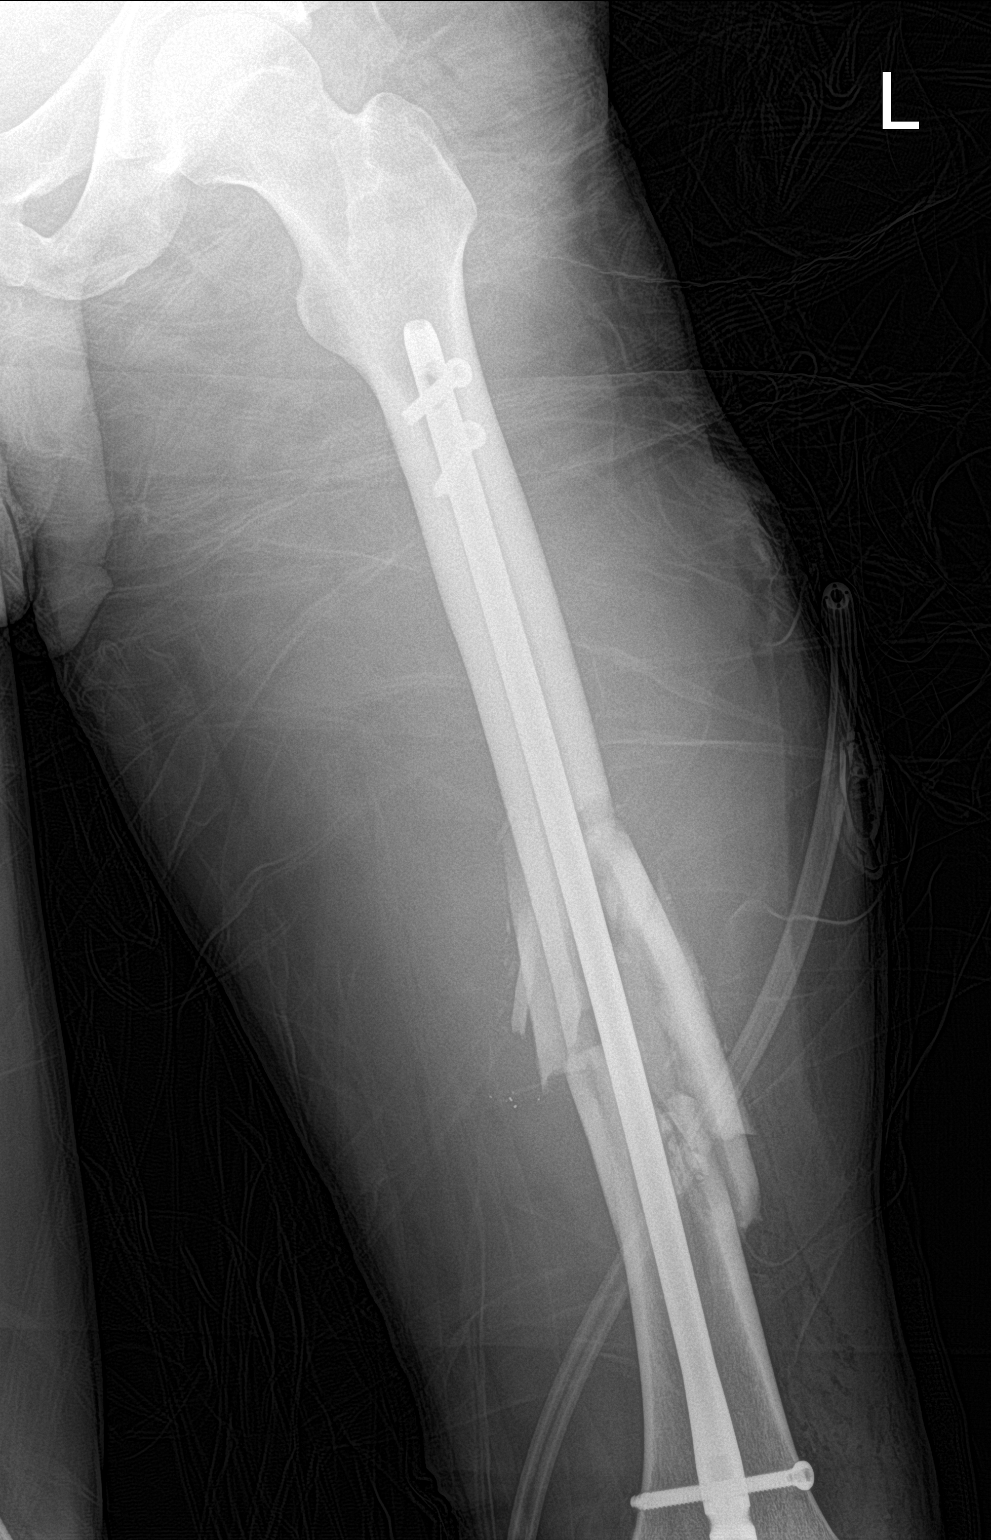

[femur ap (2 of 2)]
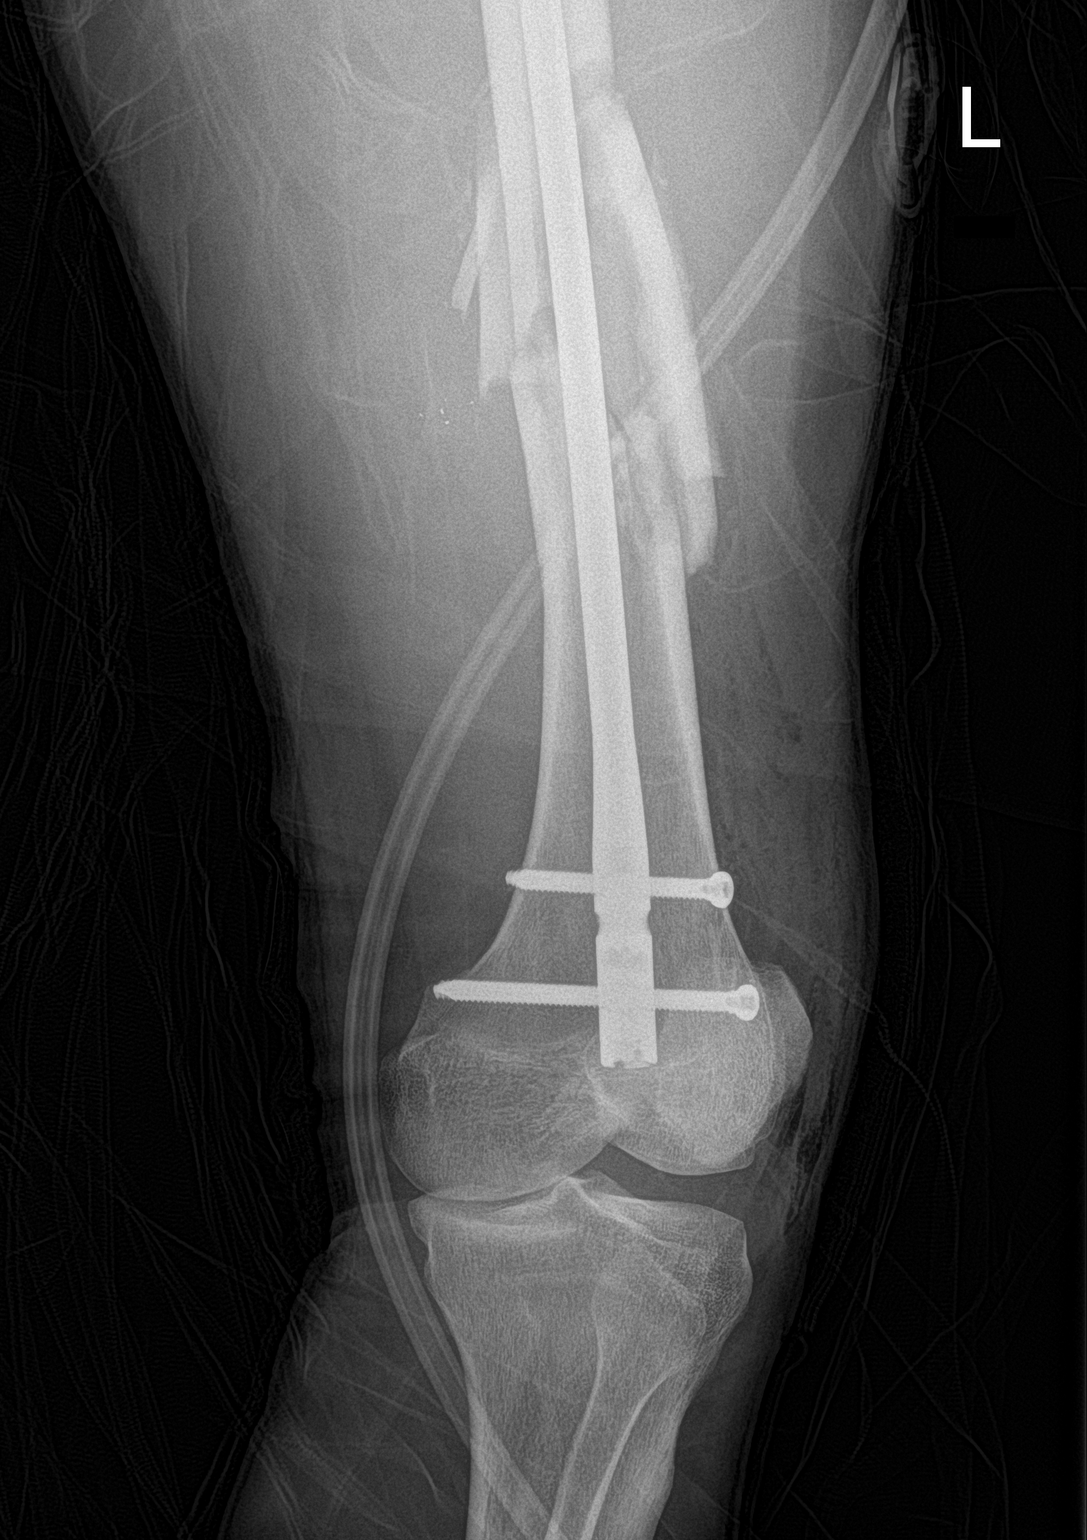

[femur lat (2 of 2)]
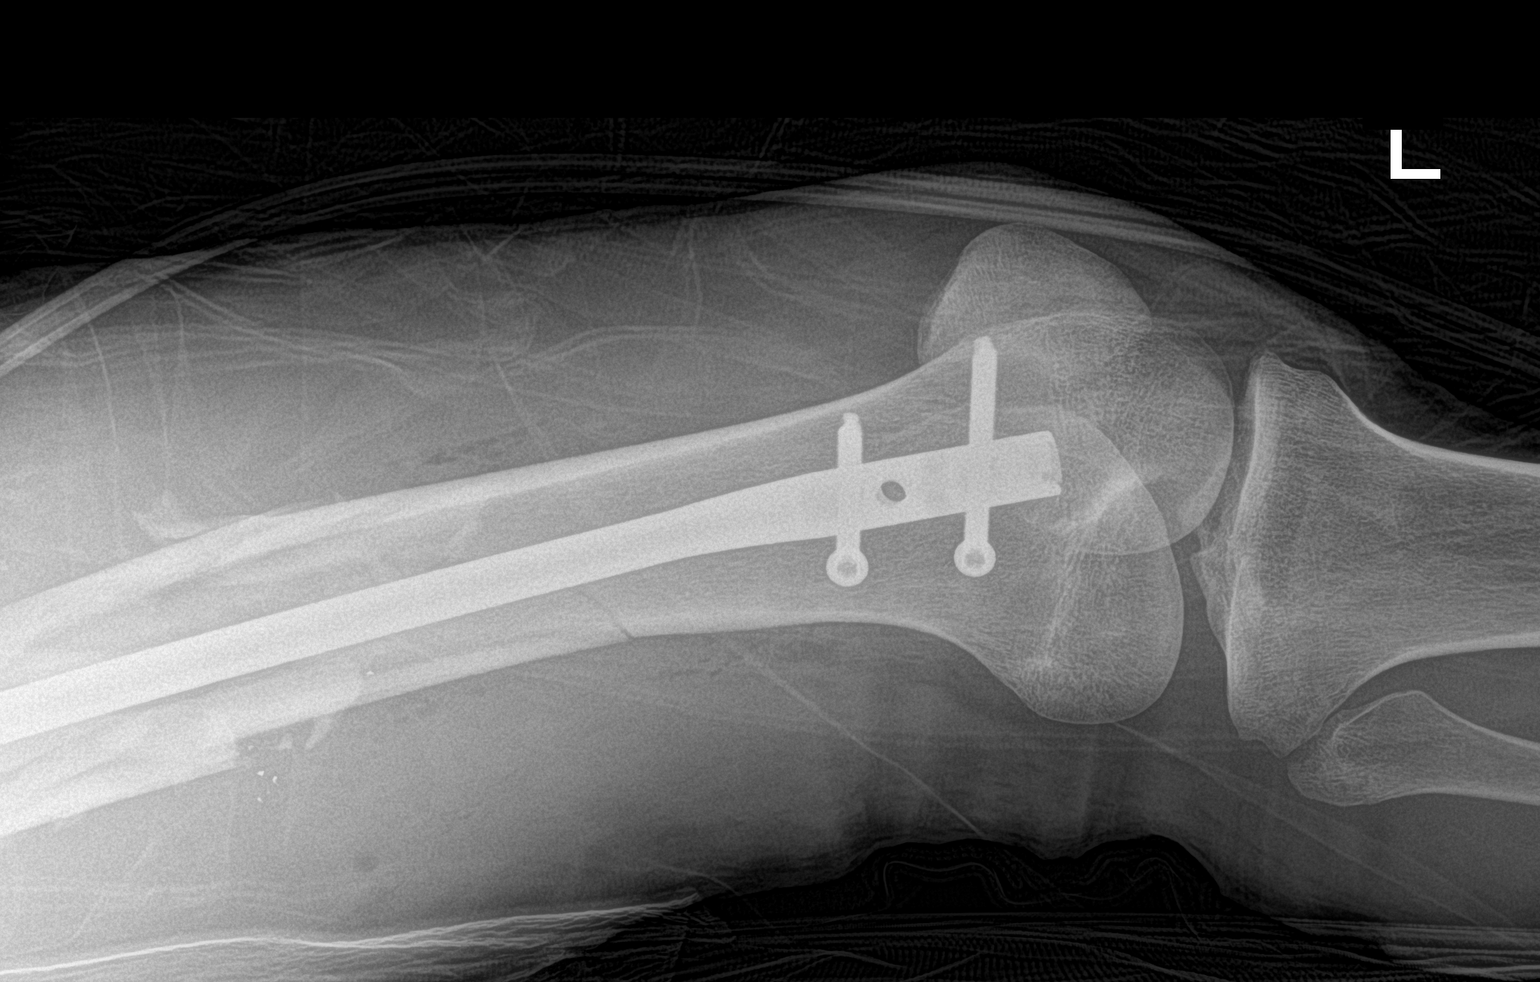

[4 of 4 positions shown; findings below may reference images not displayed]

FINDINGS: Interval intramedullary rod with proximal and distal screw fixation
across highly comminuted mid femoral shaft fracture. Displaced bone
fragments both anterior and posterior with small metallic densities
at the fracture site. Residual gas in the soft tissues.
IMPRESSION: Interval intramedullary rod and screw fixation of comminuted femoral
shaft fracture with expected postsurgical change
